# Patient Record
Sex: Male | Born: 1935 | Race: White | Hispanic: No | Marital: Married | Smoking: Former smoker
Health system: Southern US, Community
[De-identification: ages and names within clinical notes are randomized; demographics above are authoritative.]

## PROBLEM LIST (undated history)

## (undated) DIAGNOSIS — N179 Acute kidney failure, unspecified: Secondary | ICD-10-CM

## (undated) DIAGNOSIS — I10 Essential (primary) hypertension: Secondary | ICD-10-CM

## (undated) HISTORY — PX: KNEE ARTHROSCOPY: SUR90

## (undated) HISTORY — PX: CARPAL TUNNEL RELEASE: SHX101

## (undated) HISTORY — PX: APPENDECTOMY: SHX54

## (undated) HISTORY — PX: CATARACT EXTRACTION: SUR2

## (undated) HISTORY — PX: TONSILLECTOMY: SUR1361

---

## 2010-06-02 ENCOUNTER — Emergency Department (HOSPITAL_BASED_OUTPATIENT_CLINIC_OR_DEPARTMENT_OTHER)
Admission: EM | Admit: 2010-06-02 | Discharge: 2010-06-02 | Disposition: A | Discharge status: Home / Self Care | Payer: Medicare Other | Attending: Emergency Medicine | Admitting: Emergency Medicine

## 2010-06-02 ENCOUNTER — Emergency Department (INDEPENDENT_AMBULATORY_CARE_PROVIDER_SITE_OTHER): Payer: Medicare Other

## 2010-06-02 DIAGNOSIS — I10 Essential (primary) hypertension: Secondary | ICD-10-CM | POA: Insufficient documentation

## 2010-06-02 DIAGNOSIS — R197 Diarrhea, unspecified: Secondary | ICD-10-CM

## 2010-06-02 DIAGNOSIS — R1110 Vomiting, unspecified: Secondary | ICD-10-CM | POA: Insufficient documentation

## 2010-06-02 DIAGNOSIS — R0989 Other specified symptoms and signs involving the circulatory and respiratory systems: Secondary | ICD-10-CM

## 2010-06-02 DIAGNOSIS — K5289 Other specified noninfective gastroenteritis and colitis: Secondary | ICD-10-CM | POA: Insufficient documentation

## 2010-06-02 DIAGNOSIS — Z79899 Other long term (current) drug therapy: Secondary | ICD-10-CM | POA: Insufficient documentation

## 2010-06-02 DIAGNOSIS — R112 Nausea with vomiting, unspecified: Secondary | ICD-10-CM

## 2010-06-02 DIAGNOSIS — R05 Cough: Secondary | ICD-10-CM

## 2010-06-02 DIAGNOSIS — J45909 Unspecified asthma, uncomplicated: Secondary | ICD-10-CM | POA: Insufficient documentation

## 2010-06-02 LAB — URINALYSIS, ROUTINE W REFLEX MICROSCOPIC
Hgb urine dipstick: NEGATIVE
Nitrite: NEGATIVE
Specific Gravity, Urine: 1.014 (ref 1.005–1.030)
Urobilinogen, UA: 0.2 mg/dL (ref 0.0–1.0)
pH: 5.5 (ref 5.0–8.0)

## 2010-06-02 LAB — DIFFERENTIAL
Basophils Absolute: 0 10*3/uL (ref 0.0–0.1)
Eosinophils Relative: 1 % (ref 0–5)
Lymphocytes Relative: 7 % — ABNORMAL LOW (ref 12–46)
Lymphs Abs: 0.5 10*3/uL — ABNORMAL LOW (ref 0.7–4.0)
Neutro Abs: 6.2 10*3/uL (ref 1.7–7.7)
Neutrophils Relative %: 80 % — ABNORMAL HIGH (ref 43–77)

## 2010-06-02 LAB — CBC
HCT: 38 % — ABNORMAL LOW (ref 39.0–52.0)
RBC: 4.25 MIL/uL (ref 4.22–5.81)
RDW: 12.1 % (ref 11.5–15.5)
WBC: 7.8 10*3/uL (ref 4.0–10.5)

## 2010-06-02 LAB — COMPREHENSIVE METABOLIC PANEL
BUN: 36 mg/dL — ABNORMAL HIGH (ref 6–23)
CO2: 24 mEq/L (ref 19–32)
Calcium: 8.3 mg/dL — ABNORMAL LOW (ref 8.4–10.5)
Creatinine, Ser: 1.5 mg/dL (ref 0.4–1.5)
GFR calc non Af Amer: 45 mL/min — ABNORMAL LOW (ref >60)
Glucose, Bld: 124 mg/dL — ABNORMAL HIGH (ref 70–99)
Total Protein: 6.4 g/dL (ref 6.0–8.3)

## 2010-06-02 LAB — LIPASE, BLOOD: Lipase: 54 U/L (ref 23–300)

## 2013-09-12 ENCOUNTER — Encounter (INDEPENDENT_AMBULATORY_CARE_PROVIDER_SITE_OTHER): Payer: Medicare Other | Admitting: Ophthalmology

## 2013-09-12 DIAGNOSIS — I10 Essential (primary) hypertension: Secondary | ICD-10-CM

## 2013-09-12 DIAGNOSIS — H3581 Retinal edema: Secondary | ICD-10-CM

## 2013-09-12 DIAGNOSIS — H43819 Vitreous degeneration, unspecified eye: Secondary | ICD-10-CM

## 2013-09-12 DIAGNOSIS — E1165 Type 2 diabetes mellitus with hyperglycemia: Secondary | ICD-10-CM

## 2013-09-12 DIAGNOSIS — E1139 Type 2 diabetes mellitus with other diabetic ophthalmic complication: Secondary | ICD-10-CM

## 2013-09-12 DIAGNOSIS — E11319 Type 2 diabetes mellitus with unspecified diabetic retinopathy without macular edema: Secondary | ICD-10-CM

## 2013-09-12 DIAGNOSIS — H33309 Unspecified retinal break, unspecified eye: Secondary | ICD-10-CM

## 2013-09-12 DIAGNOSIS — H35039 Hypertensive retinopathy, unspecified eye: Secondary | ICD-10-CM

## 2013-09-19 ENCOUNTER — Ambulatory Visit (INDEPENDENT_AMBULATORY_CARE_PROVIDER_SITE_OTHER): Payer: Medicare Other | Admitting: Ophthalmology

## 2013-09-19 DIAGNOSIS — H33309 Unspecified retinal break, unspecified eye: Secondary | ICD-10-CM

## 2013-10-03 ENCOUNTER — Ambulatory Visit (INDEPENDENT_AMBULATORY_CARE_PROVIDER_SITE_OTHER): Payer: Medicare Other | Admitting: Ophthalmology

## 2013-10-03 DIAGNOSIS — H33309 Unspecified retinal break, unspecified eye: Secondary | ICD-10-CM

## 2014-02-03 ENCOUNTER — Ambulatory Visit (INDEPENDENT_AMBULATORY_CARE_PROVIDER_SITE_OTHER): Payer: Medicare Other | Admitting: Ophthalmology

## 2014-02-03 DIAGNOSIS — H43813 Vitreous degeneration, bilateral: Secondary | ICD-10-CM

## 2014-02-03 DIAGNOSIS — E11329 Type 2 diabetes mellitus with mild nonproliferative diabetic retinopathy without macular edema: Secondary | ICD-10-CM

## 2014-02-03 DIAGNOSIS — H33303 Unspecified retinal break, bilateral: Secondary | ICD-10-CM

## 2014-02-03 DIAGNOSIS — I10 Essential (primary) hypertension: Secondary | ICD-10-CM

## 2014-02-03 DIAGNOSIS — H35033 Hypertensive retinopathy, bilateral: Secondary | ICD-10-CM

## 2014-02-03 DIAGNOSIS — H3531 Nonexudative age-related macular degeneration: Secondary | ICD-10-CM

## 2014-02-03 DIAGNOSIS — E11319 Type 2 diabetes mellitus with unspecified diabetic retinopathy without macular edema: Secondary | ICD-10-CM

## 2020-11-03 ENCOUNTER — Emergency Department (HOSPITAL_BASED_OUTPATIENT_CLINIC_OR_DEPARTMENT_OTHER): Payer: Medicare PPO

## 2020-11-03 ENCOUNTER — Other Ambulatory Visit: Payer: Self-pay

## 2020-11-03 ENCOUNTER — Encounter (HOSPITAL_BASED_OUTPATIENT_CLINIC_OR_DEPARTMENT_OTHER): Payer: Self-pay | Admitting: *Deleted

## 2020-11-03 ENCOUNTER — Emergency Department (HOSPITAL_BASED_OUTPATIENT_CLINIC_OR_DEPARTMENT_OTHER)
Admission: EM | Admit: 2020-11-03 | Discharge: 2020-11-03 | Disposition: A | Discharge status: Home / Self Care | Payer: Medicare PPO | Attending: Emergency Medicine | Admitting: Emergency Medicine

## 2020-11-03 DIAGNOSIS — W19XXXA Unspecified fall, initial encounter: Secondary | ICD-10-CM

## 2020-11-03 DIAGNOSIS — Y92009 Unspecified place in unspecified non-institutional (private) residence as the place of occurrence of the external cause: Secondary | ICD-10-CM | POA: Diagnosis not present

## 2020-11-03 DIAGNOSIS — I10 Essential (primary) hypertension: Secondary | ICD-10-CM | POA: Insufficient documentation

## 2020-11-03 DIAGNOSIS — S0081XA Abrasion of other part of head, initial encounter: Secondary | ICD-10-CM | POA: Diagnosis not present

## 2020-11-03 DIAGNOSIS — S0990XA Unspecified injury of head, initial encounter: Secondary | ICD-10-CM | POA: Diagnosis present

## 2020-11-03 DIAGNOSIS — S80212A Abrasion, left knee, initial encounter: Secondary | ICD-10-CM | POA: Insufficient documentation

## 2020-11-03 DIAGNOSIS — Z87891 Personal history of nicotine dependence: Secondary | ICD-10-CM | POA: Diagnosis not present

## 2020-11-03 DIAGNOSIS — S80211A Abrasion, right knee, initial encounter: Secondary | ICD-10-CM | POA: Diagnosis not present

## 2020-11-03 DIAGNOSIS — Z79899 Other long term (current) drug therapy: Secondary | ICD-10-CM | POA: Diagnosis not present

## 2020-11-03 DIAGNOSIS — W01198A Fall on same level from slipping, tripping and stumbling with subsequent striking against other object, initial encounter: Secondary | ICD-10-CM | POA: Diagnosis not present

## 2020-11-03 HISTORY — DX: Acute kidney failure, unspecified: N17.9

## 2020-11-03 HISTORY — DX: Essential (primary) hypertension: I10

## 2020-11-03 NOTE — ED Provider Notes (Signed)
[facility] EMERGENCY DEPARTMENT Provider Note   CSN: [id number] Arrival date & time: 11/03/20  1507     History Chief Complaint  Patient presents with   Fall   Dizziness   Laceration    Brett Carr is a 87 y.o. male who presents after fall at home today, during which time he hit his head on the concrete.  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  According to him and his wife he has had difficulties with sensation of falling backwards and recurrent falls since that time.  He did undergo physical therapy completed in December 2021 which helped, however he does have some recurring episodes of sensation that he is falling backwards and on inability to catch himself.  He states that that is what occurred today.  She denies any dizziness or lightheadedness but does endorse sensation that he is falling backwards immediately prior to his fall.  States he was walking back from retrieving the mail from mailbox.  Of note patient has chronic left-sided loss of hearing as well as left facial droop and paresthesias since his surgery in 2001.  According to his wife he has not been confused, and his findings are consistent with his baseline on exam.  I personally reviewed this patient's medical record.  He has history of hypertension, acute on chronic renal failure, GERD.  HPI     Past Medical History:  Diagnosis Date   Hypertension    Renal failure (ARF), acute on chronic (HCC)     There are no problems to display for this patient.   Past Surgical History:  Procedure Laterality Date   APPENDECTOMY     CARPAL TUNNEL RELEASE     CATARACT EXTRACTION     KNEE ARTHROSCOPY     TONSILLECTOMY         No family history on file.  Social History   Tobacco Use   Smoking status: Former    Pack years: 0.00    Types: Cigarettes   Smokeless tobacco: Never  Substance Use Topics   Alcohol use: Not Currently   Drug use: Never    Home  Medications Prior to Admission medications   Medication Sig Start Date End Date Taking? Authorizing Provider  amLODipine (NORVASC) 10 MG tablet TK 1 T PO QD 11/20/17  Yes [provider]  carbamazepine (CARBATROL) 100 MG 12 hr capsule TAKE 1 CAPSULE(100 MG) BY MOUTH TWICE DAILY 05/02/16  Yes [provider]  dorzolamide (TRUSOPT) 2 % ophthalmic solution  10/12/18  Yes [provider]  EPINEPHrine 0.3 mg/0.3 mL IJ SOAJ injection Inject into the muscle. 04/03/14  Yes [provider]  latanoprost (XALATAN) 0.005 % ophthalmic solution Place 1 drop into both eyes nightly. 07/22/13  Yes [provider]  omeprazole (PRILOSEC) 40 MG capsule TAKE 1 CAPSULE(40 MG) BY MOUTH DAILY BEFORE BREAKFAST 10/16/20  Yes [provider]  terazosin (HYTRIN) 10 MG capsule TAKE 1 CAPSULE BY MOUTH EVERY NIGHT 07/28/16  Yes [provider]  Cholecalciferol 25 MCG (1000 UT) capsule Take by mouth.    [provider]  Docusate Sodium (DSS) 100 MG CAPS Take by mouth.    [provider]  Multiple Vitamins-Minerals (PRESERVISION/LUTEIN) CAPS Take by mouth.    [provider]    Allergies    Bee venom and Codeine  Review of Systems   Review of Systems  Constitutional: Negative.   HENT: Negative.    Respiratory: Negative.  Cardiovascular: Negative.   Musculoskeletal: Negative.   Skin:  Positive for wound.  Neurological:  Positive for dizziness and headaches. Negative for syncope.  Hematological:  Does not bruise/bleed easily.   Physical Exam Updated Vital Signs BP (!) 141/93   Pulse 73   Temp 98.7 F (37.1 C) (Oral)   Resp 19   Ht 5\' 4"  (1.626 m)   Wt 75.8 kg   SpO2 97%   BMI 28.67 kg/m   Physical Exam Vitals and nursing note reviewed.  Constitutional:      Appearance: He is not ill-appearing or toxic-appearing.  HENT:     Head: Normocephalic. No raccoon eyes or Battle's sign.      Right Ear: Tympanic membrane normal.      Left Ear: Tympanic membrane normal.     Nose: Nose normal.     Mouth/Throat:     Mouth: Mucous membranes are moist.     Pharynx: Oropharynx is clear. Uvula midline. No oropharyngeal exudate, posterior oropharyngeal erythema or uvula swelling.     Tonsils: No tonsillar exudate.  Eyes:     General: Lids are normal. Vision grossly intact.        Right eye: No discharge.        Left eye: No discharge.     Extraocular Movements: Extraocular movements intact.     Conjunctiva/sclera: Conjunctivae normal.     Pupils: Pupils are equal, round, and reactive to light.  Neck:     Trachea: Trachea and phonation normal.     Meningeal: Brudzinski's sign and Kernig's sign absent.  Cardiovascular:     Rate and Rhythm: Normal rate and regular rhythm.     Pulses: Normal pulses.     Heart sounds: Normal heart sounds.  Pulmonary:     Effort: Pulmonary effort is normal. No tachypnea, bradypnea, accessory muscle usage, prolonged expiration or respiratory distress.     Breath sounds: Normal breath sounds. No wheezing or rales.  Chest:     Chest wall: No mass, lacerations, deformity, swelling, tenderness, crepitus or edema.  Abdominal:     General: Bowel sounds are normal. There is no distension.     Palpations: Abdomen is soft.     Tenderness: There is no abdominal tenderness. There is no right CVA tenderness, left CVA tenderness, guarding or rebound.  Musculoskeletal:        General: No deformity.     Right shoulder: Normal.     Left shoulder: Normal.     Right upper arm: Normal.     Left upper arm: Normal.     Right elbow: Normal.     Left elbow: Normal.     Right forearm: Normal.     Left forearm: Normal.     Right wrist: Normal.     Left wrist: Normal.     Right hand: Normal.     Left hand: Normal.     Cervical back: Normal range of motion and neck supple. No edema, rigidity, tenderness, bony tenderness or crepitus. No pain with movement, spinous process tenderness or muscular tenderness.      Thoracic back: Normal. No spasms or bony tenderness. No scoliosis.     Lumbar back: Normal. No tenderness or bony tenderness.     Right hip: Normal.     Left hip: Normal.     Right upper leg: Normal.     Left upper leg: Normal.     Right knee: Normal.     Left knee: Normal.  Right lower leg: Normal. No edema.     Left lower leg: Normal. No edema.     Right ankle: Normal.     Right Achilles Tendon: Normal.     Left ankle: Normal.     Left Achilles Tendon: Normal.     Right foot: Normal.     Left foot: Normal.  Lymphadenopathy:     Cervical: No cervical adenopathy.  Skin:    General: Skin is warm and dry.     Capillary Refill: Capillary refill takes less than 2 seconds.     Findings: Abrasion and wound present.       Neurological:     Mental Status: He is alert and oriented to person, place, and time. Mental status is at baseline.     Cranial Nerves: Facial asymmetry present.     Sensory: Sensation is intact.     Motor: Motor function is intact.     Coordination: Coordination is intact.     Gait: Gait is intact.     Comments: Baseline facial asymmetry and loss of hearing in the left ear since prior craniotomy; neurologic exam otherwise without focal deficit.  Psychiatric:        Mood and Affect: Mood normal.    ED Results / Procedures / Treatments   Labs (all labs ordered are listed, but only abnormal results are displayed) Labs Reviewed - No data to display  EKG EKG Interpretation  Date/Time:  Tuesday November 03 2020 15:21:38 EDT Ventricular Rate:  78 PR Interval:    QRS Duration: 152 QT Interval:  370 QTC Calculation: 421 R Axis:   137 Text Interpretation: Atrial fibrillation vs other atrial rhythm Right bundle branch block Abnormal ECG No sig change from Feb 2012 ecg Confirmed by Alvester Chou [phone]) on 11/03/2020 3:37:02 PM  Radiology CT Head Wo Contrast  Result Date: 11/03/2020 CLINICAL DATA:  Head trauma, minor EXAM: CT HEAD WITHOUT CONTRAST  TECHNIQUE: Contiguous axial images were obtained from the base of the skull through the vertex without intravenous contrast. COMPARISON:  MRI of the brain September 29, 2016 FINDINGS: Brain: No evidence of acute infarction, hemorrhage or hydrocephalus. 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 Widening of the left cerebellopontine angle cistern, unchanged from prior MRI. Vascular: No hyperdense vessel. Calcified plaques in the bilateral carotid siphons and vertebral arteries. Skull: Postsurgical changes from left suboccipital craniotomy. No acute fracture. Sinuses/Orbits: No acute finding. Other: None. IMPRESSION: No acute intracranial abnormality. Electronically Signed   By: Baldemar Lenis M.D.   On: 11/03/2020 16:25    Procedures Procedures   Medications Ordered in ED Medications - No data to display  ED Course  I have reviewed the triage vital signs and the nursing notes.  Pertinent labs & imaging results that were available during my care of the patient were reviewed by me and considered in my medical decision making (see chart for details).    MDM Rules/Calculators/A&P                         87 year old male presents after fall with wound to the head, no LOC, nausea, vomiting, blurry, or double vision since the fall.  Hypertensive on intake, vital signs otherwise normal.  Cardiopulmonary exam is normal, abdominal exam is benign.  Musculoskeletal exam revealed bilateral anterior knee abrasions, otherwise unremarkable.  Patient ambulatory in the ED.  HEENT exam did reveal large abrasion to the right  parietal area, oozing blood, no lacerations.  Facial asymmetry due to chronic left-sided facial droop following prior craniotomy, alert and oriented, no new focal deficit on neurologic exam.  CT of the head negative for acute intracranial abnormality.  EKG was abnormal atrial rhythm.  Wound cleaned by this  provider and dressed, no laceration to require repair.  Recommend close follow-up with cardiology for abnormal heart rhythm which has been seen in the past.  May follow-up with PCP regarding possible need for repeat physical therapy.  No further work-up warranted in the ED at this time.  Channing Mutters voiced understanding with medical evaluation treatment plan.  She was questions was answered to his expressed inspection.  Return precautions given.  Patient is well-appearing, stable, and appropriate for discharge at this time.  This chart was dictated using voice recognition software, Dragon. Despite the best efforts of this provider to proofread and correct errors, errors may still occur which can change documentation meaning.  Final Clinical Impression(s) / ED Diagnoses Final diagnoses:  Fall, initial encounter    Rx / DC Orders ED Discharge Orders     None        Sherrilee Gilles 11/03/20 1755    Terald Sleeper, MD 11/04/20 1049

## 2020-11-03 NOTE — ED Triage Notes (Signed)
He walked to the bathroom became dizzy and fell hitting the right side of his head on the concrete. Bleeding controlled. No LOC. He does not take blood thinners. He is alert oriented.

## 2020-11-03 NOTE — Discharge Instructions (Addendum)
You are seen in the ER today after your fall.  Your physical exam and CT scan were very reassuring.  There is no bleeding in your brain.  You do have some scrapes to your knees and to the top of your head.  You may dress these with antibiotic ointment and clean dressing every day.  You may shower as normal.  Please follow-up with your cardiologist for the abnormal heart rhythm seen on your EKG today.  And return to the ER if you develop any worsening dizziness, lightheadedness, chest pain, shortness of breath, if you pass out, or develop any other new severe symptoms.

## 2021-12-05 IMAGING — CT CT HEAD W/O CM
3 series · 16 of 47 positions shown, 19 images · non-contrast
Comparison: MRI of the brain September 29, 2016

CLINICAL DATA: Head trauma, minor

EXAM:
CT HEAD WITHOUT CONTRAST
TECHNIQUE: Contiguous axial images were obtained from the base of the skull
through the vertex without intravenous contrast.

[Series 2: head wo · axial · 0.44mm/px · z∈[-135,+0]mm · 10 of 33 slices shown, 13 images]
[im 3/33  brain]
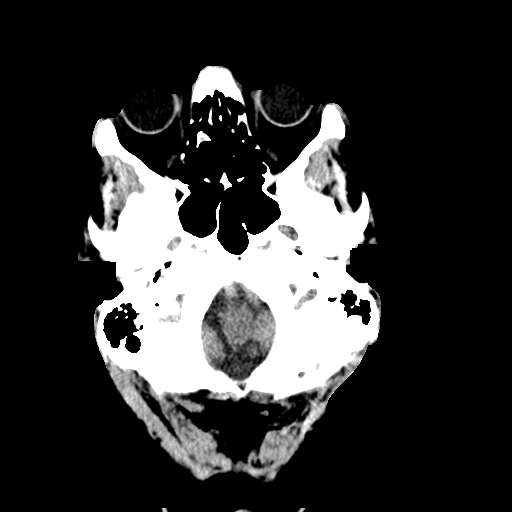
[im 3/33  bone]
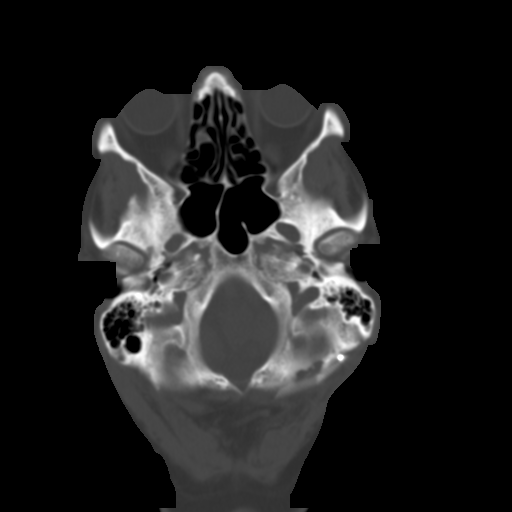
[im 6/33  brain]
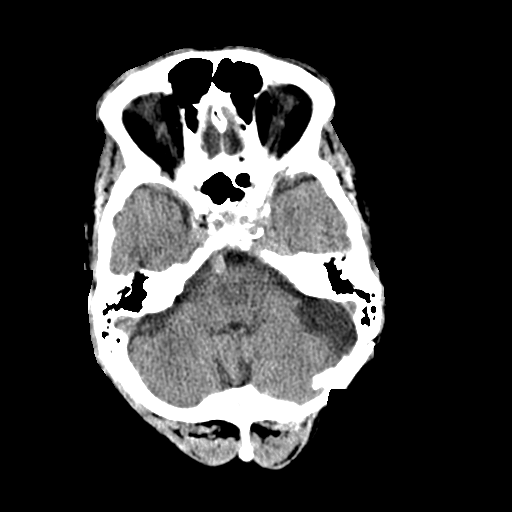
[im 9/33  brain]
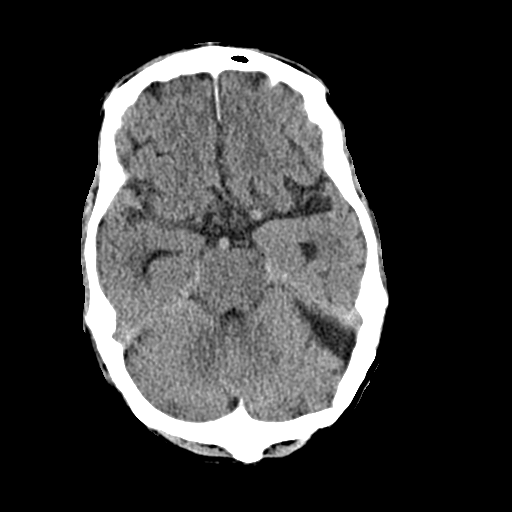
[im 12/33  brain]
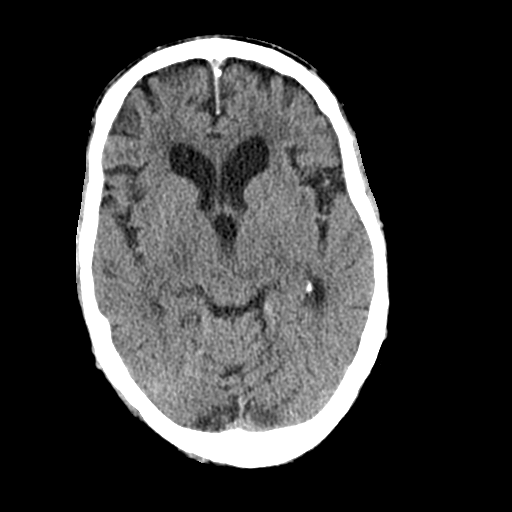
[im 15/33  brain]
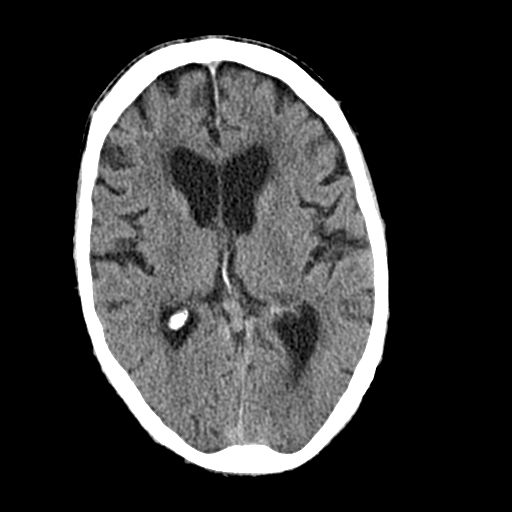
[im 15/33  bone]
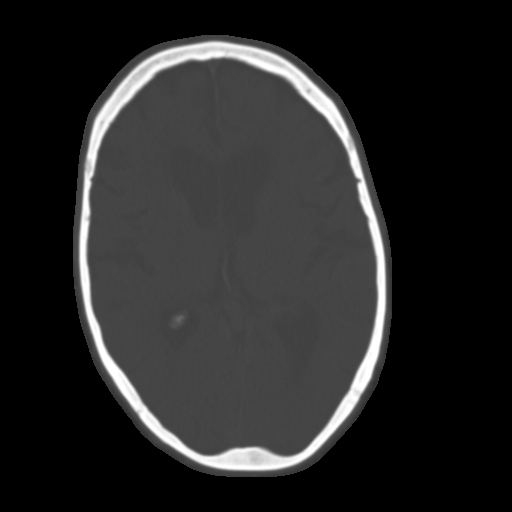
[im 18/33  brain]
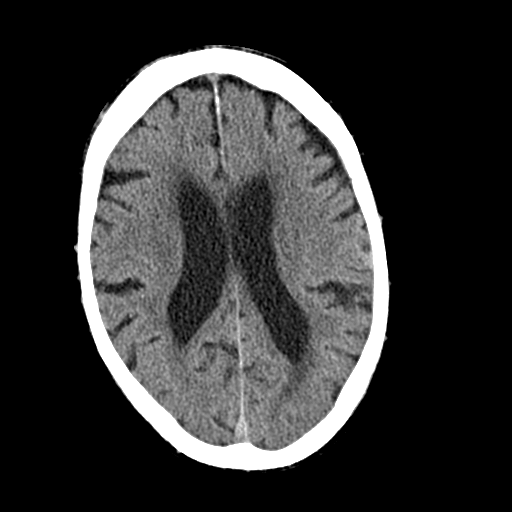
[im 21/33  brain]
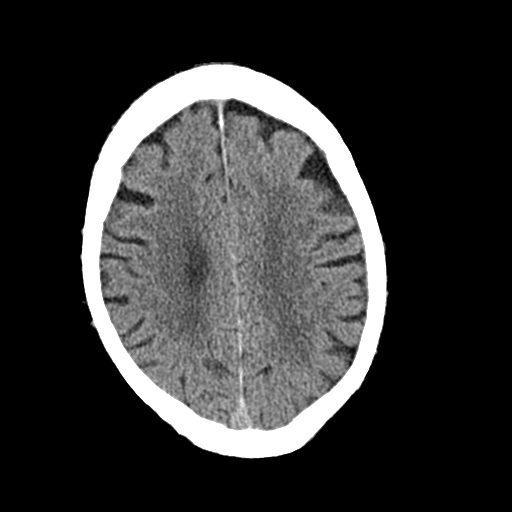
[im 25/33  brain]
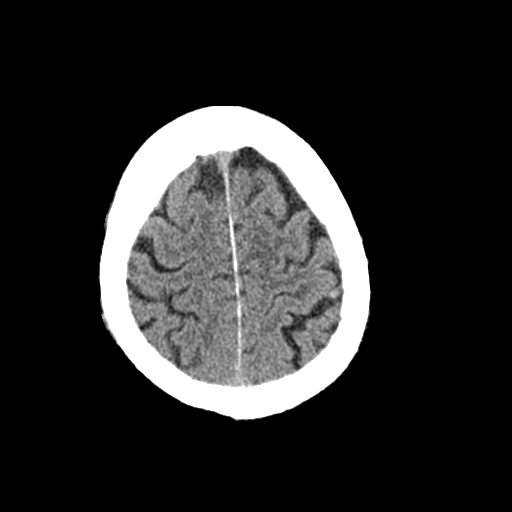
[im 27/33  brain]
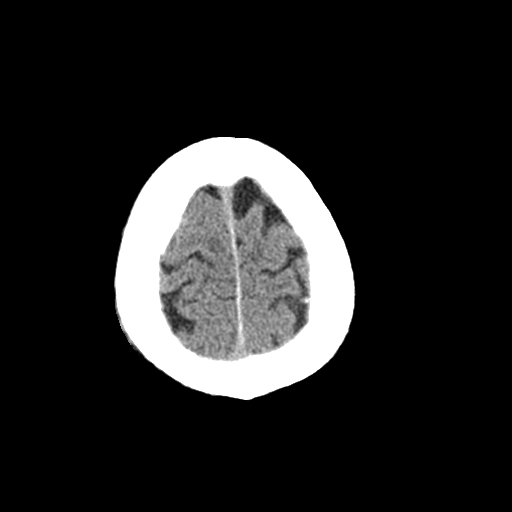
[im 27/33  bone]
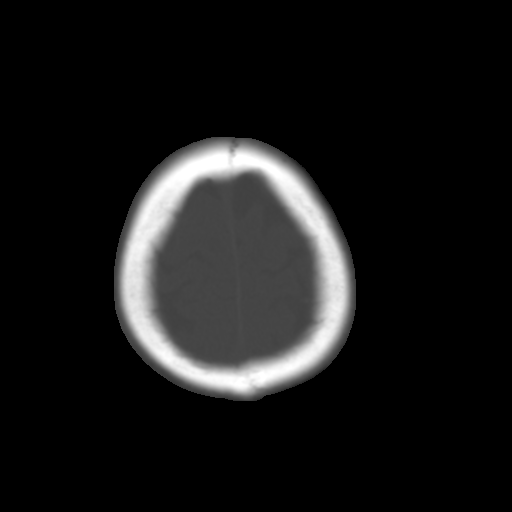
[im 30/33  brain]
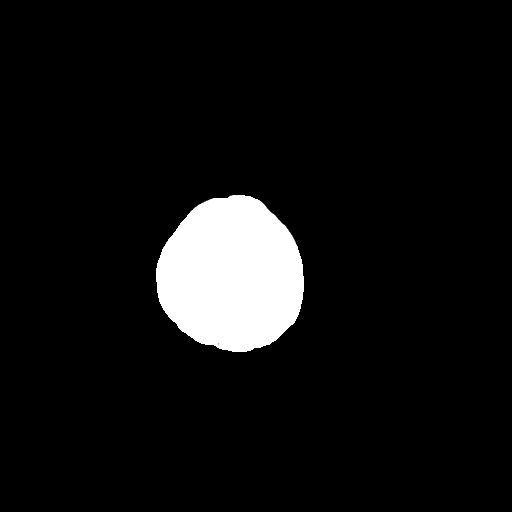

[Series 4: cor soft · coronal · 0.34mm/px · 3 of 68 slices shown]
[im 23/68  brain]
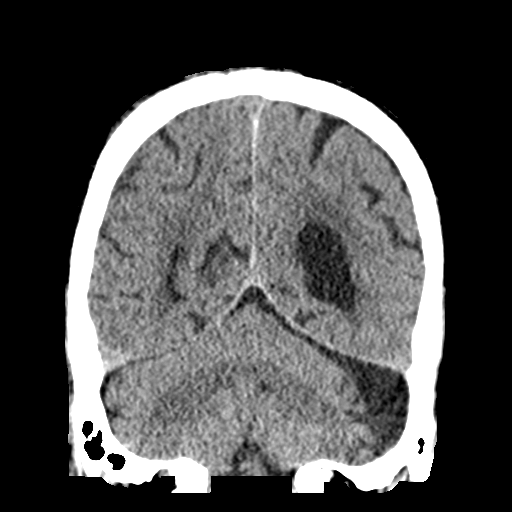
[im 30/68  brain]
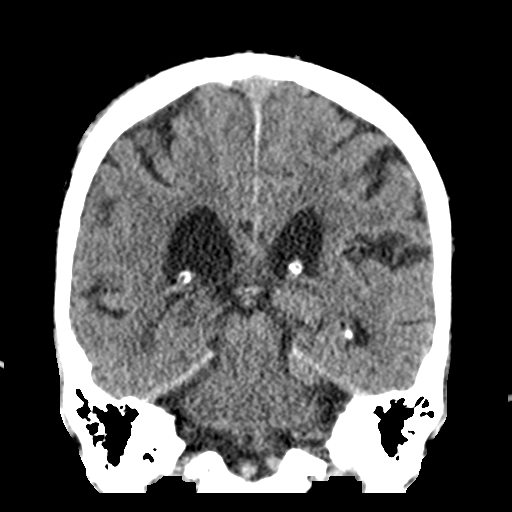
[im 38/68  brain]
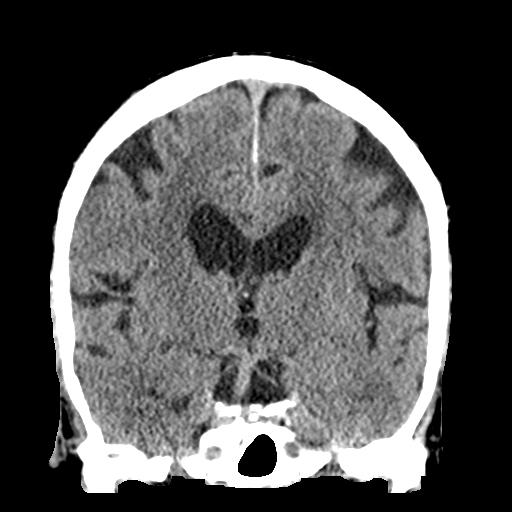

[Series 5: sag soft · sagittal · 0.34mm/px · 3 of 52 slices shown]
[im 18/52  brain]
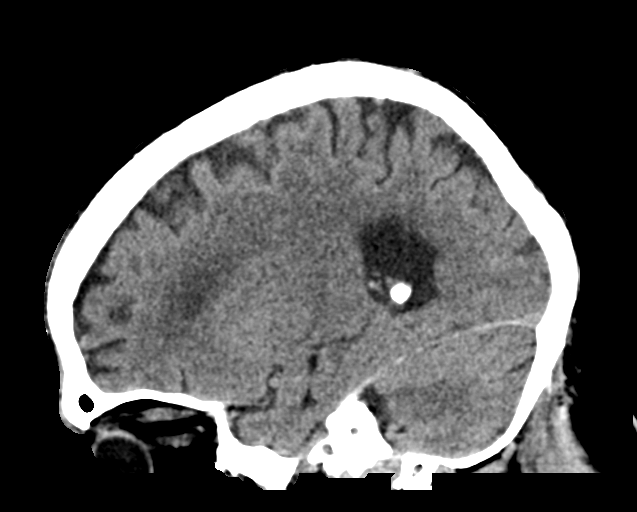
[im 26/52  brain]
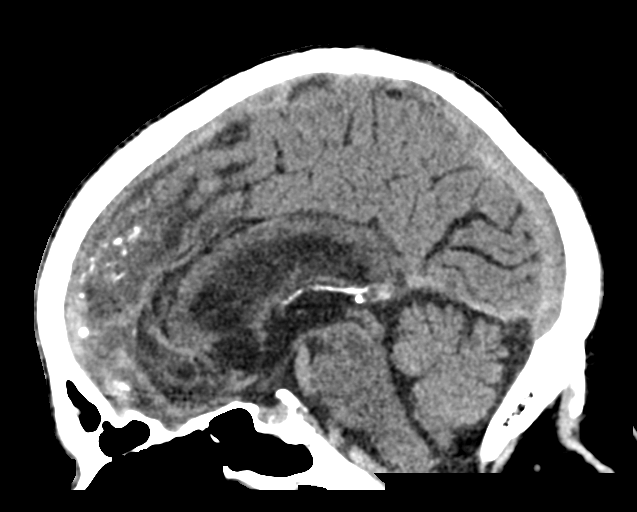
[im 35/52  brain]
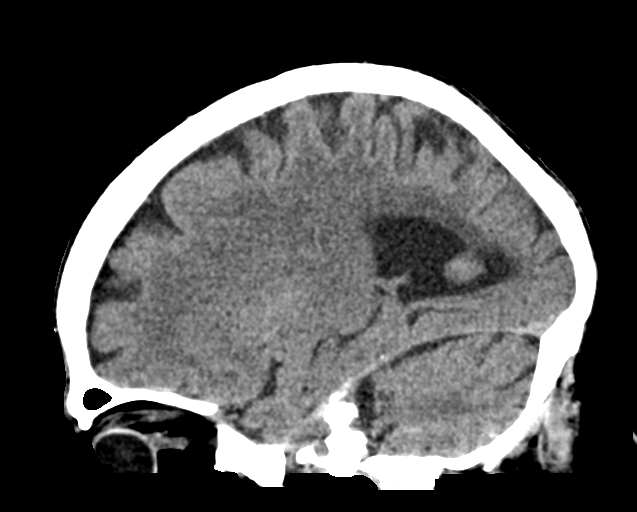

[16 of 47 positions shown; findings below may reference images not displayed]

FINDINGS: Brain: No evidence of acute infarction, hemorrhage or hydrocephalus.
Mild patchy hypodensity of the periventricular white matter,
nonspecific, most likely related to chronic microangiopathy.
Widening of the left cerebellopontine angle cistern, unchanged from
prior MRI.

Vascular: No hyperdense vessel. Calcified plaques in the bilateral
carotid siphons and vertebral arteries.

Skull: Postsurgical changes from left suboccipital craniotomy. No
acute fracture.

Sinuses/Orbits: No acute finding.

Other: None.
IMPRESSION: No acute intracranial abnormality.

## 2022-03-28 ENCOUNTER — Emergency Department (HOSPITAL_BASED_OUTPATIENT_CLINIC_OR_DEPARTMENT_OTHER)
Admission: EM | Admit: 2022-03-28 | Discharge: 2022-03-28 | Disposition: A | Discharge status: Home / Self Care | Payer: Medicare PPO | Attending: Emergency Medicine | Admitting: Emergency Medicine

## 2022-03-28 ENCOUNTER — Emergency Department (HOSPITAL_BASED_OUTPATIENT_CLINIC_OR_DEPARTMENT_OTHER): Payer: Medicare PPO

## 2022-03-28 ENCOUNTER — Encounter (HOSPITAL_BASED_OUTPATIENT_CLINIC_OR_DEPARTMENT_OTHER): Payer: Self-pay

## 2022-03-28 DIAGNOSIS — W19XXXA Unspecified fall, initial encounter: Secondary | ICD-10-CM

## 2022-03-28 DIAGNOSIS — M25521 Pain in right elbow: Secondary | ICD-10-CM | POA: Diagnosis not present

## 2022-03-28 DIAGNOSIS — S5011XA Contusion of right forearm, initial encounter: Secondary | ICD-10-CM | POA: Insufficient documentation

## 2022-03-28 DIAGNOSIS — Z79899 Other long term (current) drug therapy: Secondary | ICD-10-CM | POA: Insufficient documentation

## 2022-03-28 DIAGNOSIS — W010XXA Fall on same level from slipping, tripping and stumbling without subsequent striking against object, initial encounter: Secondary | ICD-10-CM | POA: Insufficient documentation

## 2022-03-28 DIAGNOSIS — S59911A Unspecified injury of right forearm, initial encounter: Secondary | ICD-10-CM | POA: Diagnosis present

## 2022-03-28 NOTE — ED Triage Notes (Signed)
[facility] notified triage staff that patient was in lobby having difficulty breathing. Patient brought back to triage room for eval. Sats 97% RA but patient reports he feels SOB

## 2022-03-28 NOTE — ED Provider Notes (Signed)
[facility] EMERGENCY DEPARTMENT Provider Note   CSN: [id number] Arrival date & time: 03/28/22  1017     History  Chief Complaint  Patient presents with   Fall   Elbow Pain    Brett Carr is a 88 y.o. male.  HPI    88 year old male with chief complaint of fall Patient had a mechanical fall few days back when he tripped.  In the process, patient injured his elbow.  Patient states that the right elbow has since been swollen and bruised.  He is having pain with any movement of the elbow.  Patient denies any numbness, tingling.  Patient denies any direct head trauma. Pt has no headaches, nausea, vomiting, seizures, loss of consciousness or new visual complains, weakness, numbness, dizziness or gait instability.     Home Medications Prior to Admission medications   Medication Sig Start Date End Date Taking? Authorizing Provider  amLODipine (NORVASC) 10 MG tablet TK 1 T PO QD 11/20/17   [provider]  carbamazepine (CARBATROL) 100 MG 12 hr capsule TAKE 1 CAPSULE(100 MG) BY MOUTH TWICE DAILY 05/02/16   [provider]  Cholecalciferol 25 MCG (1000 UT) capsule Take by mouth.    [provider]  Docusate Sodium (DSS) 100 MG CAPS Take by mouth.    [provider]  dorzolamide (TRUSOPT) 2 % ophthalmic solution  10/12/18   [provider]  EPINEPHrine 0.3 mg/0.3 mL IJ SOAJ injection Inject into the muscle. 04/03/14   [provider]  latanoprost (XALATAN) 0.005 % ophthalmic solution Place 1 drop into both eyes nightly. 07/22/13   [provider]  Multiple Vitamins-Minerals (PRESERVISION/LUTEIN) CAPS Take by mouth.    [provider]  omeprazole (PRILOSEC) 40 MG capsule TAKE 1 CAPSULE(40 MG) BY MOUTH DAILY BEFORE BREAKFAST 10/16/20   [provider]  terazosin (HYTRIN) 10 MG capsule TAKE 1 CAPSULE BY MOUTH EVERY NIGHT 07/28/16   [provider]      Allergies    Bee venom and Codeine     Review of Systems   Review of Systems  All other systems reviewed and are negative.   Physical Exam Updated Vital Signs BP (!) 149/87 (BP Location: Left Arm)   Pulse 80   Temp 97.7 F (36.5 C) (Oral)   Resp 20   SpO2 97%  Physical Exam Vitals and nursing note reviewed.  Constitutional:      Appearance: He is well-developed.  HENT:     Head: Atraumatic.  Cardiovascular:     Rate and Rhythm: Normal rate.  Pulmonary:     Effort: Pulmonary effort is normal.  Musculoskeletal:        General: Swelling and tenderness present. No deformity.     Cervical back: Neck supple.  Skin:    General: Skin is warm.     Findings: Bruising present.  Neurological:     Mental Status: He is alert and oriented to person, place, and time.     ED Results / Procedures / Treatments   Labs (all labs ordered are listed, but only abnormal results are displayed) Labs Reviewed - No data to display  EKG None  Radiology No results found.  Procedures Procedures    Medications Ordered in ED Medications - No data to display  ED Course/ Medical Decision Making/ A&P                           Medical Decision Making Amount and/or  Complexity of Data Reviewed Radiology: ordered.   88 year old patient comes with a chief complaint of mechanical fall. Patient is history of hypertension, acute kidney injury.  He had a mechanical fall 3 to 4 days ago, since then he has noted increased swelling over the elbow along with increased bruising and the pain and difficulty in using his right upper extremity has persisted.  Patient has no red flag suggesting TBI.  CT scan of the brain not indicated at this time.  He has localized orthopedic injury, appropriate x-rays ordered  -I reviewed the x-ray of the elbow.  There is some effusion of the elbow.  Radiologist is requesting that we get dedicated elbow films.  Dedicated elbow films ordered. I have independently interpreted the initial x-rays.   Patient's care has been signed out to incoming team.  Anticipate discharge.  Patient placed in a sling.  Regardless he will need orthopedic surgery follow-up.  Final Clinical Impression(s) / ED Diagnoses Final diagnoses:  Fall, initial encounter  Right elbow pain    Rx / DC Orders ED Discharge Orders          Ordered    AMB referral to sports medicine        03/28/22 1617              Derwood Kaplan, MD 04/01/22 [phone]

## 2022-03-28 NOTE — ED Notes (Signed)
Pt provided discharge instructions and prescription information. Pt was given the opportunity to ask questions and questions were answered.   

## 2022-03-28 NOTE — ED Triage Notes (Signed)
Pt states he fell Wednesday, concerned for continued bruising. Bruising noted along posterior surface of R forearm, swelling to R hand, pt c/o "the most" pain to R elbow

## 2022-03-28 NOTE — ED Provider Notes (Signed)
  Physical Exam  BP (!) 174/107 (BP Location: Left Arm)   Pulse 100   Temp 98.4 F (36.9 C)   Resp (!) 26   SpO2 99%     Procedures  Procedures  ED Course / MDM    Medical Decision Making Amount and/or Complexity of Data Reviewed Radiology: ordered.   70M presenting for bruising to the forearm after a fall. 2 view XR of the elbow equivocal for fracture, 4 view ordered to further evaluate. In a sling, likely DC, follow-up XR.  X-ray imaging negative for acute fracture.  We will place the patient in a sling and have him follow-up outpatient.       Ernie Avena, MD 03/28/22 1616

## 2022-03-28 NOTE — Discharge Instructions (Addendum)
Your x-ray imaging was negative for acute fracture or dislocation.  Wear sling for comfort, recommend Tylenol for pain control.

## 2022-06-24 ENCOUNTER — Emergency Department (HOSPITAL_BASED_OUTPATIENT_CLINIC_OR_DEPARTMENT_OTHER)
Admission: EM | Admit: 2022-06-24 | Discharge: 2022-06-25 | Disposition: A | Discharge status: Home / Self Care | Payer: Medicare PPO | Attending: Emergency Medicine | Admitting: Emergency Medicine

## 2022-06-24 ENCOUNTER — Other Ambulatory Visit: Payer: Self-pay

## 2022-06-24 ENCOUNTER — Emergency Department (HOSPITAL_BASED_OUTPATIENT_CLINIC_OR_DEPARTMENT_OTHER): Payer: Medicare PPO

## 2022-06-24 ENCOUNTER — Encounter (HOSPITAL_BASED_OUTPATIENT_CLINIC_OR_DEPARTMENT_OTHER): Payer: Self-pay

## 2022-06-24 DIAGNOSIS — M545 Low back pain, unspecified: Secondary | ICD-10-CM | POA: Diagnosis not present

## 2022-06-24 DIAGNOSIS — Z79899 Other long term (current) drug therapy: Secondary | ICD-10-CM | POA: Insufficient documentation

## 2022-06-24 DIAGNOSIS — S301XXA Contusion of abdominal wall, initial encounter: Secondary | ICD-10-CM | POA: Insufficient documentation

## 2022-06-24 DIAGNOSIS — I10 Essential (primary) hypertension: Secondary | ICD-10-CM | POA: Insufficient documentation

## 2022-06-24 DIAGNOSIS — W19XXXA Unspecified fall, initial encounter: Secondary | ICD-10-CM

## 2022-06-24 DIAGNOSIS — S3991XA Unspecified injury of abdomen, initial encounter: Secondary | ICD-10-CM | POA: Diagnosis present

## 2022-06-24 DIAGNOSIS — W108XXA Fall (on) (from) other stairs and steps, initial encounter: Secondary | ICD-10-CM | POA: Diagnosis not present

## 2022-06-24 DIAGNOSIS — Z87891 Personal history of nicotine dependence: Secondary | ICD-10-CM | POA: Diagnosis not present

## 2022-06-24 NOTE — ED Triage Notes (Signed)
Pt c/o left side pain. Pt reports he fell going up the steps outside tonight around 18:00. Pt thinks he may have hit his side on the brick steps.

## 2022-06-24 NOTE — ED Provider Notes (Signed)
[facility] Provider Note: Brett Spurling, MD, FACEP  CSN: [id number] MRN: [id number] ARRIVAL: 06/24/22 at [place]: [place] PRESENT ILLNESS  06/24/22 11:21 PM Brett Carr is a 89 y.o. male he fell going up steps about 6 PM.  He hit his left lower back and now has pain and tenderness over his left iliac crest posteriorly.  He rates his pain as a 5 out of 10.  He is having no difficulty breathing.  He has had no hematuria.   Past Medical History:  Diagnosis Date   Hypertension    Renal failure (ARF), acute on chronic (HCC)     Past Surgical History:  Procedure Laterality Date   APPENDECTOMY     CARPAL TUNNEL RELEASE     CATARACT EXTRACTION     KNEE ARTHROSCOPY     TONSILLECTOMY      History reviewed. No pertinent family history.  Social History   Tobacco Use   Smoking status: Former    Types: Cigarettes   Smokeless tobacco: Never  Substance Use Topics   Alcohol use: Not Currently   Drug use: Never    Prior to Admission medications   Medication Sig Start Date End Date Taking? Authorizing Provider  amLODipine (NORVASC) 10 MG tablet TK 1 T PO QD 11/20/17   [provider]  carbamazepine (CARBATROL) 100 MG 12 hr capsule TAKE 1 CAPSULE(100 MG) BY MOUTH TWICE DAILY 05/02/16   [provider]  Cholecalciferol 25 MCG (1000 UT) capsule Take by mouth.    [provider]  Docusate Sodium (DSS) 100 MG CAPS Take by mouth.    [provider]  dorzolamide (TRUSOPT) 2 % ophthalmic solution  10/12/18   [provider]  EPINEPHrine 0.3 mg/0.3 mL IJ SOAJ injection Inject into the muscle. 04/03/14   [provider]  latanoprost (XALATAN) 0.005 % ophthalmic solution Place 1 drop into both eyes nightly. 07/22/13   [provider]  Multiple Vitamins-Minerals (PRESERVISION/LUTEIN) CAPS Take by mouth.    [provider]  omeprazole (PRILOSEC) 40 MG capsule TAKE 1  CAPSULE(40 MG) BY MOUTH DAILY BEFORE BREAKFAST 10/16/20   [provider]  terazosin (HYTRIN) 10 MG capsule TAKE 1 CAPSULE BY MOUTH EVERY NIGHT 07/28/16   [provider]    Allergies Bee venom and Codeine   REVIEW OF SYSTEMS  Negative except as noted here or in the History of Present Illness.   PHYSICAL EXAMINATION  Initial Vital Signs Blood pressure (!) 156/74, pulse 79, temperature 98.3 F (36.8 C), temperature source Oral, resp. rate 18, height '5\' 4"'$  (1.626 m), weight 72.6 kg, SpO2 97 %.  Examination General: Well-developed, well-nourished male in no acute distress; appearance consistent with age of record HENT: normocephalic; atraumatic Eyes: Normal appearance Neck: supple Heart: regular rate and rhythm Lungs: clear to auscultation bilaterally Abdomen: soft; nondistended; nontender; bowel sounds present Back: Tenderness and swelling over posterior left iliac crest Extremities: No deformity; full range of motion Neurologic: Awake, alert and oriented; motor function intact in all extremities and symmetric; no facial droop Skin: Warm and dry Psychiatric: Normal mood and affect   RESULTS  Summary of this visit's results, reviewed and interpreted by myself:   EKG Interpretation  Date/Time:    Ventricular Rate:    PR Interval:    QRS Duration:   QT Interval:    QTC Calculation:   R Axis:     Text Interpretation:  Laboratory Studies: No results found for this or any previous visit (from the past 24 hour(s)). Imaging Studies: DG Pelvis 1-2 Views  Result Date: 06/25/2022 CLINICAL DATA:  Recent fall with left-sided iliac pain, initial encounter EXAM: PELVIS - 1 VIEW COMPARISON:  None Available. FINDINGS: Pelvic ring is intact. Degenerative changes of the lumbar spine and hip joints are noted. No acute fracture is seen. No soft tissue changes are noted. IMPRESSION: No acute abnormality noted. Electronically Signed   By: Brett Carr M.D.   On:  06/25/2022 00:05   DG Chest 2 View  Result Date: 06/24/2022 CLINICAL DATA:  Fall EXAM: CHEST - 2 VIEW COMPARISON:  11/19/2020 FINDINGS: Lungs are clear.  No pleural effusion or pneumothorax. The heart is normal in size.  Thoracic aortic atherosclerosis. Degenerative changes of the visualized thoracolumbar spine. IMPRESSION: Normal chest radiographs. Electronically Signed   By: Brett Carr M.D.   On: 06/24/2022 21:24    ED COURSE and MDM  Nursing notes, initial and subsequent vitals signs, including pulse oximetry, reviewed and interpreted by myself.  Vitals:   06/24/22 2033 06/24/22 2040 06/24/22 2355 06/24/22 2356  BP: (!) 156/74  (!) 170/92   Pulse: 79  83 83  Resp: 18  17   Temp: 98.3 F (36.8 C)     TempSrc: Oral     SpO2: 97%  98% 98%  Weight:  72.6 kg    Height:  '5\' 4"'$  (1.626 m)     Medications - No data to display  No evidence of fracture on radiographs.  I suspect the patient contused his left iliac crest.  He was advised to use ice packs for the next 24 to 48 hours.  PROCEDURES  Procedures   ED DIAGNOSES     ICD-10-CM   1. Fall, initial encounter  W19.XXXA     2. Contusion of iliac crest, initial encounter  S30.1XXA          [name], [name], MD 06/25/22 [phone]
